# Patient Record
Sex: Male | Born: 1995 | Race: Black or African American | Hispanic: No | Marital: Single | Smoking: Current every day smoker
Health system: Southern US, Community
[De-identification: ages and names within clinical notes are randomized; demographics above are authoritative.]

## PROBLEM LIST (undated history)

## (undated) ENCOUNTER — Emergency Department (HOSPITAL_COMMUNITY): Discharge status: Against Medical Advice | Payer: No Typology Code available for payment source

---
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
## 2018-01-30 ENCOUNTER — Encounter (HOSPITAL_COMMUNITY): Payer: Self-pay | Admitting: Emergency Medicine

## 2018-01-30 ENCOUNTER — Emergency Department (HOSPITAL_COMMUNITY)
Admission: EM | Admit: 2018-01-30 | Discharge: 2018-01-30 | Disposition: A | Discharge status: Home / Self Care | Payer: Self-pay | Attending: Emergency Medicine | Admitting: Emergency Medicine

## 2018-01-30 DIAGNOSIS — S00412A Abrasion of left ear, initial encounter: Secondary | ICD-10-CM | POA: Insufficient documentation

## 2018-01-30 DIAGNOSIS — Y939 Activity, unspecified: Secondary | ICD-10-CM | POA: Insufficient documentation

## 2018-01-30 DIAGNOSIS — F17210 Nicotine dependence, cigarettes, uncomplicated: Secondary | ICD-10-CM | POA: Insufficient documentation

## 2018-01-30 DIAGNOSIS — Y999 Unspecified external cause status: Secondary | ICD-10-CM | POA: Insufficient documentation

## 2018-01-30 DIAGNOSIS — Y929 Unspecified place or not applicable: Secondary | ICD-10-CM | POA: Insufficient documentation

## 2018-01-30 DIAGNOSIS — X58XXXA Exposure to other specified factors, initial encounter: Secondary | ICD-10-CM | POA: Insufficient documentation

## 2018-01-30 MED ORDER — OFLOXACIN 0.3 % OT SOLN: 10.0000 [drp] | Freq: Every day | OTIC | 0 refills | Status: AC
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
## 2018-01-30 NOTE — ED Triage Notes (Signed)
Pt reports L ear pain onset PTA. Pt thinks he has a bug stuck in his ear, he hears "flapping" Upon assessment, no bug. Ear drum appears to be ruptured, some blood noted.

## 2018-01-30 NOTE — Discharge Instructions (Addendum)
1. Medications: Ofloxacin (sent to your pharmacy), usual home medications 2. Treatment: rest, drink plenty of fluids,  3. Follow Up: Please followup with your primary doctor in 3-5 days for discussion of your diagnoses and further evaluation after today's visit; Please return to the ER for increasing pain, fevers, decreasing hearing or other concerns

## 2018-01-30 NOTE — ED Provider Notes (Signed)
[facility]   CSN: [id number] Arrival date & time: 01/30/18  0252     History   Chief Complaint Chief Complaint  Patient presents with  . Ear Pain    HPI Kyle Klein is a 22 y.o. male with a hx of otitis externa presents to the Emergency Department complaining of acute, now improved left ear pain.  Pt reports he awoke from sleep feeling as if there was something in his ear.  Patient reports that he subsequently stuck a Bobby pin down in his ear in an effort to get it out.  He does not think that the bug came out.  He reports since that time he has had sharp pain in his left ear.  He denies decreased hearing, fevers, tinnitus, vomiting, headache, vision changes or neck pain.  Patient denies any recent URI symptoms, nasal congestion, fevers or cough.  No treatments prior to arrival.  The history is provided by the patient and medical records. No language interpreter was used.    History reviewed. No pertinent past medical history.  Patient Active Problem List   Diagnosis Date Noted  . Otitis externa 08/21/2013  . Well adolescent visit 04/16/2013  . Left knee pain 04/16/2013    History reviewed. No pertinent surgical history.      Home Medications    Prior to Admission medications   Medication Sig Start Date End Date Taking? Authorizing Provider  ofloxacin (FLOXIN) 0.3 % OTIC solution Place 10 drops into the left ear daily for 7 days. 01/30/18 02/06/18  Zarriah Starkel, Dahlia ClientHannah, PA-C    Family History No family history on file.  Social History Social History   Tobacco Use  . Smoking status: Current Every Day Smoker    Types: Cigarettes  . Smokeless tobacco: Never Used  Substance Use Topics  . Alcohol use: Yes  . Drug use: No     Allergies   Cephalosporins   Review of Systems Review of Systems  Constitutional: Negative for fever.  HENT: Negative for congestion, rhinorrhea, sinus pressure and sinus pain.     Eyes: Negative for discharge and visual disturbance.  Gastrointestinal: Negative for nausea and vomiting.  Musculoskeletal: Negative for neck pain.  Skin: Negative for rash.  Allergic/Immunologic: Negative for immunocompromised state.  Neurological: Negative for headaches.  Hematological: Negative for adenopathy.  Psychiatric/Behavioral: The patient is not nervous/anxious.      Physical Exam Updated Vital Signs BP 123/71 (BP Location: Right Arm)   Pulse 73   Temp 98 F (36.7 C) (Oral)   Resp 14   Ht 5\' 6"  (1.676 m)   Wt 59 kg   SpO2 100%   BMI 20.99 kg/m   Physical Exam  Constitutional: He appears well-developed and well-nourished. No distress.  HENT:  Head: Normocephalic and atraumatic.  Right Ear: Hearing, external ear and ear canal normal. Tympanic membrane is scarred.  Left Ear: Hearing and external ear normal. Tympanic membrane is scarred. Tympanic membrane is not perforated, not erythematous, not retracted and not bulging.  Nose: No mucosal edema or rhinorrhea. No epistaxis. Right sinus exhibits no maxillary sinus tenderness and no frontal sinus tenderness. Left sinus exhibits no maxillary sinus tenderness and no frontal sinus tenderness.  Abrasion to the left ear canal.  TM is intact.  No middle ear effusion, erythematous or bulging TM. Bilateral scarring to the TMs from previous tympanostomy tubes  Eyes: Conjunctivae are normal.  Neck: Normal range of motion and full passive range of motion without  pain.  Cardiovascular: Normal rate.  Pulmonary/Chest: Effort normal.  Abdominal: He exhibits no distension.  Musculoskeletal: Normal range of motion.  Lymphadenopathy:    He has no cervical adenopathy.  Neurological: He is alert.  Skin: Skin is warm and dry.  Psychiatric: He has a normal mood and affect.  Nursing note and vitals reviewed.    ED Treatments / Results  Labs (all labs ordered are listed, but only abnormal results are displayed) Labs Reviewed - No  data to display  EKG None  Radiology No results found.  Procedures Procedures (including critical care time)  Medications Ordered in ED Medications - No data to display   Initial Impression / Assessment and Plan / ED Course  I have reviewed the triage vital signs and the nursing notes.  Pertinent labs & imaging results that were available during my care of the patient were reviewed by me and considered in my medical decision making (see chart for details).     Presents with left ear pain.  Suspect he may have had insect versus foreign body in his ear prior to attempting to remove it with a Bobby pin.  Abrasion with small amount of blood noted to the ear canal.  TM is not ruptured, despite triage note.  No evidence of otitis media.  Patient is well-appearing, with discharged with topical antibiotics.  Will have close primary care follow-up.  Final Clinical Impressions(s) / ED Diagnoses   Final diagnoses:  Ear canal abrasion, left, initial encounter    ED Discharge Orders         Ordered    ofloxacin (FLOXIN) 0.3 % OTIC solution  Daily     01/30/18 0411           Mikesha Migliaccio, Dahlia ClientHannah, PA-C 01/30/18 0418    Zadie RhineWickline, Donald, MD 01/30/18 [phone]

## 2018-01-30 NOTE — ED Notes (Signed)
Patient is A&Ox4.  No signs of distress noted.  Please see providers complete history and physical exam.  

## 2018-08-22 ENCOUNTER — Emergency Department (HOSPITAL_COMMUNITY)
Admission: EM | Admit: 2018-08-22 | Discharge: 2018-08-22 | Disposition: A | Discharge status: Home / Self Care | Payer: Medicaid Other

## 2018-08-22 ENCOUNTER — Other Ambulatory Visit: Payer: Self-pay
